# Patient Record
Sex: Male | Born: 1985 | Race: Black or African American | Hispanic: No | Marital: Single
Health system: Southern US, Community
[De-identification: ages and names within clinical notes are randomized; demographics above are authoritative.]

---
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
## 2003-04-07 ENCOUNTER — Emergency Department (HOSPITAL_COMMUNITY): Admission: EM | Admit: 2003-04-07 | Discharge: 2003-04-07 | Payer: Self-pay | Admitting: Emergency Medicine

## 2003-05-31 ENCOUNTER — Emergency Department (HOSPITAL_COMMUNITY): Admission: EM | Admit: 2003-05-31 | Discharge: 2003-05-31 | Payer: Self-pay | Admitting: Emergency Medicine
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
## 2016-12-29 ENCOUNTER — Ambulatory Visit (HOSPITAL_COMMUNITY)
Admission: EM | Admit: 2016-12-29 | Discharge: 2016-12-29 | Disposition: A | Discharge status: Home / Self Care | Payer: Self-pay | Attending: Emergency Medicine | Admitting: Emergency Medicine

## 2016-12-29 ENCOUNTER — Encounter (HOSPITAL_COMMUNITY): Payer: Self-pay | Admitting: Emergency Medicine

## 2016-12-29 DIAGNOSIS — M7541 Impingement syndrome of right shoulder: Secondary | ICD-10-CM

## 2016-12-29 MED ORDER — IBUPROFEN 800 MG PO TABS: 800.0000 mg | ORAL_TABLET | Freq: Three times a day (TID) | ORAL | 0 refills | Status: AC

## 2016-12-29 NOTE — ED Provider Notes (Signed)
[facility]    CSN: [id number] Arrival date & time: 12/29/16  1620   History   Chief Complaint Chief Complaint  Patient presents with  . Arm Pain    HPI Lavalle Skoda is a 31 y.o. male.  Patient is presenting today with complaints of right upper arm pain with some numbness and weakness. Patient denies any hx of old or recent injury.  The patient states he writes with his left hand that his right arm is his dominant arm. Denies significant medical history.  HPI  History reviewed. No pertinent past medical history.  There are no active problems to display for this patient.   History reviewed. No pertinent surgical history.   Home Medications    Prior to Admission medications   Medication Sig Start Date End Date Taking? Authorizing Provider  amphetamine-dextroamphetamine (ADDERALL XR) 20 MG 24 hr capsule Take 20 mg by mouth daily.   Yes [provider]  ibuprofen (ADVIL,MOTRIN) 800 MG tablet Take 1 tablet (800 mg total) by mouth 3 (three) times daily. 12/29/16   Servando Salina, NP    Family History No family history on file.  Social History Social History  Substance Use Topics  . Smoking status: Not on file  . Smokeless tobacco: Not on file  . Alcohol use Not on file     Allergies   Patient has no known allergies.   Review of Systems Review of Systems  Constitutional: Negative for fatigue and fever.  HENT: Negative.   Eyes: Negative.   Respiratory: Negative.  Negative for cough, chest tightness and shortness of breath.   Cardiovascular: Negative.  Negative for chest pain and palpitations.  Gastrointestinal: Negative.  Negative for diarrhea, nausea and vomiting.  Endocrine: Negative.   Genitourinary: Negative.   Musculoskeletal: Negative.  Negative for neck pain and neck stiffness.  Skin: Negative.  Negative for wound.  Allergic/Immunologic: Negative.   Neurological: Positive for weakness. Negative for dizziness, facial asymmetry,  speech difficulty and headaches.  Hematological: Negative.   Psychiatric/Behavioral: Negative.      Physical Exam Triage Vital Signs ED Triage Vitals [12/29/16 1631]  Enc Vitals Group     BP (!) 134/95     Pulse Rate 98     Resp 16     Temp 98.1 F (36.7 C)     Temp Source Oral     SpO2 100 %     Weight      Height      Head Circumference      Peak Flow      Pain Score      Pain Loc      Pain Edu?      Excl. in GC?    No data found.   Updated Vital Signs BP (!) 134/95 (BP Location: Right Arm)   Pulse 98   Temp 98.1 F (36.7 C) (Oral)   Resp 16   SpO2 100%   Visual Acuity Right Eye Distance:   Left Eye Distance:   Bilateral Distance:    Right Eye Near:   Left Eye Near:    Bilateral Near:     Physical Exam  Constitutional: He is oriented to person, place, and time. He appears well-developed and well-nourished. No distress.  Eyes: Pupils are equal, round, and reactive to light. Conjunctivae and EOM are normal. Right eye exhibits no discharge. Left eye exhibits no discharge. No scleral icterus.  Neck: Normal range of motion. Neck supple.  Negative for nuchal rigidity.  Cardiovascular: Normal rate, regular rhythm, normal heart sounds and intact distal pulses.  Exam reveals no gallop and no friction rub.   No murmur heard. Pulmonary/Chest: Effort normal and breath sounds normal. No respiratory distress. He has no wheezes. He has no rales. He exhibits no tenderness.  Musculoskeletal: Normal range of motion. He exhibits no edema, tenderness or deformity.  Lymphadenopathy:    He has no cervical adenopathy.  Neurological: He is alert and oriented to person, place, and time. He has normal strength. He displays no atrophy, no tremor and normal reflexes. No cranial nerve deficit or sensory deficit. He exhibits normal muscle tone. He displays a negative Romberg sign. Coordination and gait normal.  Cranial nerves II through XII grossly intact.  Heel-to-toe and tandem gaits  intact.  Drinks 5/5 all 4 extremities.  Patient presents in no acute or apparent distress.  There is no obvious asymmetry or deformity of the right shoulder as compared to the left shoulder. No surface trauma or ecchymosis. Crepitus is not present.  There is no bony deformity or prominence of the humeral head. No erythema, warmth, or swelling appreciated.  The patient has no tenderness to palpation of the clavicle, AC joint, and acromion process. Some discomfort with palpation of humeral head bursa.  No tenderness over the scapula.  No tenderness to palpation of the musculature. There is no pain or limitation noted with active and passive abduction and adduction; and internal and external rotation with flexion and extension. Negative drop arm test. Normal sensation over the deltoid and ability to flex elbow intact.  Distal motor and neurovascular status is intact.Bilateral upper extremity strength 5/5. Color, movement, and distal sensation intact. Cap refill less than 3 seconds to distal phalanges bilaterally.    Skin: Skin is warm and dry. Capillary refill takes less than 2 seconds. He is not diaphoretic.  Nursing note and vitals reviewed.    UC Treatments / Results  Labs (all labs ordered are listed, but only abnormal results are displayed) Labs Reviewed - No data to display  EKG  EKG Interpretation None       Radiology No results found.  Procedures Procedures (including critical care time)  Medications Ordered in UC Medications - No data to display   Initial Impression / Assessment and Plan / UC Course  I have reviewed the triage vital signs and the nursing notes.  Pertinent labs & imaging results that were available during my care of the patient were reviewed by me and considered in my medical decision making (see chart for details).   Placed patient in sling for comfort, ice and anti-inflammatories.  Follow up with Ortho.    Final Clinical Impressions(s) / UC Diagnoses    Final diagnoses:  Shoulder impingement, right    New Prescriptions New Prescriptions   IBUPROFEN (ADVIL,MOTRIN) 800 MG TABLET    Take 1 tablet (800 mg total) by mouth 3 (three) times daily.     Controlled Substance Prescriptions [place] Controlled Substance Registry consulted? Not Applicable   Discussed s/s of stroke or vascular emergency. The usual and customary discharge instructions and warnings were given.  The patient verbalizes understanding and agrees to plan of care.      Servando Salina, NP 12/29/16 1705

## 2016-12-29 NOTE — Discharge Instructions (Signed)
Rest, Ice and sling for comfort.  Any changes that we discussed such as a worsening of symptoms or year hand becoming cold and blue, go immediately to the nearest emergency department.

## 2016-12-29 NOTE — ED Triage Notes (Signed)
Pt states he woke up Friday with pain and numbness in right upper arm.

## 2017-10-16 DEATH — deceased
